# Patient Record
Sex: Female | Born: 1945 | Race: Black or African American | Hispanic: No | Marital: Married | Smoking: Never smoker
Health system: Southern US, Community
[De-identification: ages and names within clinical notes are randomized; demographics above are authoritative.]

## PROBLEM LIST (undated history)

## (undated) DIAGNOSIS — I10 Essential (primary) hypertension: Secondary | ICD-10-CM

## (undated) DIAGNOSIS — J45909 Unspecified asthma, uncomplicated: Secondary | ICD-10-CM

---

## 2014-03-20 ENCOUNTER — Encounter (HOSPITAL_COMMUNITY): Payer: Self-pay | Admitting: Emergency Medicine

## 2014-03-20 DIAGNOSIS — I10 Essential (primary) hypertension: Secondary | ICD-10-CM | POA: Diagnosis not present

## 2014-03-20 DIAGNOSIS — K529 Noninfective gastroenteritis and colitis, unspecified: Secondary | ICD-10-CM | POA: Insufficient documentation

## 2014-03-20 DIAGNOSIS — J45909 Unspecified asthma, uncomplicated: Secondary | ICD-10-CM | POA: Diagnosis not present

## 2014-03-20 DIAGNOSIS — R110 Nausea: Secondary | ICD-10-CM | POA: Diagnosis present

## 2014-03-20 NOTE — ED Notes (Signed)
Pt. felt nauseous with loose stools after eating a sandwich this afternoon , denies fever or chills.

## 2014-03-21 ENCOUNTER — Emergency Department (HOSPITAL_COMMUNITY)
Admission: EM | Admit: 2014-03-21 | Discharge: 2014-03-21 | Disposition: A | Discharge status: Home / Self Care | Payer: PRIVATE HEALTH INSURANCE | Attending: Emergency Medicine | Admitting: Emergency Medicine

## 2014-03-21 ENCOUNTER — Emergency Department (HOSPITAL_COMMUNITY): Payer: PRIVATE HEALTH INSURANCE

## 2014-03-21 ENCOUNTER — Encounter (HOSPITAL_COMMUNITY): Payer: Self-pay | Admitting: Radiology

## 2014-03-21 DIAGNOSIS — K529 Noninfective gastroenteritis and colitis, unspecified: Secondary | ICD-10-CM | POA: Diagnosis not present

## 2014-03-21 HISTORY — DX: Unspecified asthma, uncomplicated: J45.909

## 2014-03-21 HISTORY — DX: Essential (primary) hypertension: I10

## 2014-03-21 LAB — COMPREHENSIVE METABOLIC PANEL
ALK PHOS: 48 U/L (ref 39–117)
ALT: 18 U/L (ref 0–35)
AST: 29 U/L (ref 0–37)
Albumin: 4 g/dL (ref 3.5–5.2)
Anion gap: 9 (ref 5–15)
BUN: 14 mg/dL (ref 6–23)
CHLORIDE: 101 meq/L (ref 96–112)
CO2: 27 mmol/L (ref 19–32)
Calcium: 9.8 mg/dL (ref 8.4–10.5)
Creatinine, Ser: 1.02 mg/dL (ref 0.50–1.10)
GFR calc non Af Amer: 55 mL/min — ABNORMAL LOW (ref >90)
GFR, EST AFRICAN AMERICAN: 64 mL/min — AB (ref >90)
Glucose, Bld: 119 mg/dL — ABNORMAL HIGH (ref 70–99)
Potassium: 3.7 mmol/L (ref 3.5–5.1)
Sodium: 137 mmol/L (ref 135–145)
Total Bilirubin: 0.5 mg/dL (ref 0.3–1.2)
Total Protein: 7.4 g/dL (ref 6.0–8.3)

## 2014-03-21 LAB — CBC WITH DIFFERENTIAL/PLATELET
Basophils Absolute: 0 10*3/uL (ref 0.0–0.1)
Basophils Relative: 0 % (ref 0–1)
Eosinophils Absolute: 0 10*3/uL (ref 0.0–0.7)
Eosinophils Relative: 0 % (ref 0–5)
HCT: 41.1 % (ref 36.0–46.0)
Hemoglobin: 13.6 g/dL (ref 12.0–15.0)
Lymphocytes Relative: 11 % — ABNORMAL LOW (ref 12–46)
Lymphs Abs: 1.3 10*3/uL (ref 0.7–4.0)
MCH: 27.7 pg (ref 26.0–34.0)
MCHC: 33.1 g/dL (ref 30.0–36.0)
MCV: 83.7 fL (ref 78.0–100.0)
Monocytes Absolute: 1 10*3/uL (ref 0.1–1.0)
Monocytes Relative: 8 % (ref 3–12)
Neutro Abs: 10.1 10*3/uL — ABNORMAL HIGH (ref 1.7–7.7)
Neutrophils Relative %: 81 % — ABNORMAL HIGH (ref 43–77)
PLATELETS: 333 10*3/uL (ref 150–400)
RBC: 4.91 MIL/uL (ref 3.87–5.11)
RDW: 13.4 % (ref 11.5–15.5)
WBC: 12.4 10*3/uL — ABNORMAL HIGH (ref 4.0–10.5)

## 2014-03-21 LAB — LIPASE, BLOOD: Lipase: 27 U/L (ref 11–59)

## 2014-03-21 LAB — URINALYSIS, ROUTINE W REFLEX MICROSCOPIC
Bilirubin Urine: NEGATIVE
Glucose, UA: NEGATIVE mg/dL
HGB URINE DIPSTICK: NEGATIVE
KETONES UR: NEGATIVE mg/dL
Leukocytes, UA: NEGATIVE
NITRITE: NEGATIVE
Protein, ur: NEGATIVE mg/dL
Specific Gravity, Urine: 1.022 (ref 1.005–1.030)
UROBILINOGEN UA: 1 mg/dL (ref 0.0–1.0)
pH: 6.5 (ref 5.0–8.0)

## 2014-03-21 MED ORDER — IOHEXOL 300 MG/ML  SOLN
100.0000 mL | Freq: Once | INTRAMUSCULAR | Status: AC | PRN
  Administered 2014-03-21: 100 mL via INTRAVENOUS

## 2014-03-21 MED ORDER — METRONIDAZOLE 500 MG PO TABS
500.0000 mg | ORAL_TABLET | Freq: Three times a day (TID) | ORAL | Status: AC
Start: 2014-03-21 — End: ?

## 2014-03-21 MED ORDER — CIPROFLOXACIN HCL 500 MG PO TABS: 500.0000 mg | ORAL_TABLET | Freq: Two times a day (BID) | ORAL | Status: AC

## 2014-03-21 MED ORDER — SODIUM CHLORIDE 0.9 % IV BOLUS (SEPSIS)
1000.0000 mL | Freq: Once | INTRAVENOUS | Status: AC
Start: 2014-03-21 — End: 2014-03-21
  Administered 2014-03-21: 1000 mL via INTRAVENOUS

## 2014-03-21 MED ORDER — IOHEXOL 300 MG/ML  SOLN
25.0000 mL | Freq: Once | INTRAMUSCULAR | Status: AC | PRN
  Administered 2014-03-21: 25 mL via ORAL

## 2014-03-21 MED ORDER — HYDROCODONE-ACETAMINOPHEN 5-325 MG PO TABS: 1.0000 | ORAL_TABLET | Freq: Four times a day (QID) | ORAL | Status: AC | PRN

## 2014-03-21 NOTE — Discharge Instructions (Signed)
Cipro and Flagyl as prescribed.  Hydrocodone as prescribed as needed for pain.  Follow-up with your gastroenterologist next week, and return to the ER if your symptoms substantially worsen or change.   Colitis Colitis is inflammation of the colon. Colitis can be a short-term or long-standing (chronic) illness. Crohn's disease and ulcerative colitis are 2 types of colitis which are chronic. They usually require lifelong treatment. CAUSES  There are many different causes of colitis, including:  Viruses.  Germs (bacteria).  Medicine reactions. SYMPTOMS   Diarrhea.  Intestinal bleeding.  Pain.  Fever.  Throwing up (vomiting).  Tiredness (fatigue).  Weight loss.  Bowel blockage. DIAGNOSIS  The diagnosis of colitis is based on examination and stool or blood tests. X-rays, CT scan, and colonoscopy may also be needed. TREATMENT  Treatment may include:  Fluids given through the vein (intravenously).  Bowel rest (nothing to eat or drink for a period of time).  Medicine for pain and diarrhea.  Medicines (antibiotics) that kill germs.  Cortisone medicines.  Surgery. HOME CARE INSTRUCTIONS   Get plenty of rest.  Drink enough water and fluids to keep your urine clear or pale yellow.  Eat a well-balanced diet.  Call your caregiver for follow-up as recommended. SEEK IMMEDIATE MEDICAL CARE IF:   You develop chills.  You have an oral temperature above 102 F (38.9 C), not controlled by medicine.  You have extreme weakness, fainting, or dehydration.  You have repeated vomiting.  You develop severe belly (abdominal) pain or are passing bloody or tarry stools. MAKE SURE YOU:   Understand these instructions.  Will watch your condition.  Will get help right away if you are not doing well or get worse. Document Released: 04/15/2004 Document Revised: 05/31/2011 Document Reviewed: 07/11/2009 [facility] Patient Information 2015 [employer], [place]. This information is  not intended to replace advice given to you by your health care provider. Make sure you discuss any questions you have with your health care provider.

## 2014-03-21 NOTE — ED Notes (Signed)
Called Ct to inform of patient finishing contrast.

## 2014-03-21 NOTE — ED Notes (Signed)
Dr. Delo at bedside. 

## 2014-03-21 NOTE — ED Provider Notes (Signed)
CSN: [id number]     Arrival date & time 03/20/14  2342 History   First MD Initiated Contact with Patient 03/21/14 0005     This chart was scribed for Geoffery Lyonsouglas Shray Hunley, MD by Arlan OrganAshley Leger, ED Scribe. This patient was seen in room B18C/B18C and the patient's care was started 12:08 AM.   Chief Complaint  Patient presents with  . Nausea  . Diarrhea   Patient is a 68 y.o. female presenting with diarrhea. The history is provided by the patient. No language interpreter was used.  Diarrhea Quality:  Blood-tinged and watery Severity:  Mild Number of episodes:  1 Timing:  Rare Progression:  Unchanged Relieved by:  None tried Worsened by:  Nothing tried Ineffective treatments:  None tried Associated symptoms: abdominal pain   Associated symptoms: no chills and no fever   Abdominal pain:    Location:  Generalized   Severity:  Mild   Timing:  Rare   Progression:  Resolved   Chronicity:  New   HPI Comments: Sherri Becker is a 68 y.o. female with a PMHx of HTN who presents to the Emergency Department complaining of intermittent, mild nausea onset this afternoon. Pt also reports abdominal pain, diaphoresis, and 1 episode of diarrhea after eating a Bologna sandwich this afternoon. Stools described as loose with noted clots of blood. No fever, dysuria, or chills. Last colonoscopy perfomed this year with noted diverticulosis. No known sick contacts at home. PSHx includes a hysterectomy and cyst removal. No known allergies to medications.  Past Medical History  Diagnosis Date  . Hypertension   . Asthma    History reviewed. No pertinent past surgical history. No family history on file. History  Substance Use Topics  . Smoking status: Never Smoker   . Smokeless tobacco: Not on file  . Alcohol Use: No   OB History    No data available     Review of Systems  Constitutional: Negative for fever and chills.  Gastrointestinal: Positive for nausea, abdominal pain and diarrhea.  All other  systems reviewed and are negative.     Allergies  Review of patient's allergies indicates no known allergies.  Home Medications   Prior to Admission medications   Not on File   Triage Vitals: BP 160/79 mmHg  Pulse 82  Temp(Src) 97.7 F (36.5 C) (Oral)  Resp 17  Ht 5\' 6"  (1.676 m)  Wt 180 lb (81.647 kg)  BMI 29.07 kg/m2  SpO2 98%   Physical Exam  Constitutional: She is oriented to person, place, and time. She appears well-developed and well-nourished. No distress.  HENT:  Head: Normocephalic and atraumatic.  Eyes: EOM are normal.  Neck: Normal range of motion.  Cardiovascular: Normal rate, regular rhythm and normal heart sounds.   Pulmonary/Chest: Effort normal and breath sounds normal.  Abdominal: Soft. She exhibits no distension. There is no tenderness.  Musculoskeletal: Normal range of motion.  Neurological: She is alert and oriented to person, place, and time.  Skin: Skin is warm and dry.  Psychiatric: She has a normal mood and affect. Judgment normal.  Nursing note and vitals reviewed.   ED Course  Procedures (including critical care time)  DIAGNOSTIC STUDIES: Oxygen Saturation is 98% on RA, Normal by my interpretation.    COORDINATION OF CARE: 12:08 AM-Discussed treatment plan with pt at bedside and pt agreed to plan.     Labs Review Labs Reviewed - No data to display  Imaging Review No results found.   EKG Interpretation None  MDM   Final diagnoses:  None    Patient presents with complaints of loose stools, left-sided abdominal pain, and blood since earlier today. She has an elevated white count and CT scan reveals colitis. This will be treated with Cipro and Flagyl and pain meds. She is to return as needed if symptoms worsen or change. Her hemoglobin, blood pressure are very stable and she is not actively bleeding. She has a gastroenterologist and[facility] and I've advised her to follow-up with them next week.  I personally performed the  services described in this documentation, which was scribed in my presence. The recorded information has been reviewed and is accurate.    Geoffery Lyonsouglas Shatara Stanek, MD 03/21/14 [phone]

## 2016-01-18 IMAGING — CT CT ABD-PELV W/ CM
2 of 5 series · 14 of 46 positions shown, 16 images · IV contrast (Iodine)
Comparison: None.

CLINICAL DATA: Nausea and diarrhea.

EXAM:
CT ABDOMEN AND PELVIS WITH CONTRAST
TECHNIQUE: Multidetector CT imaging of the abdomen and pelvis was performed
using the standard protocol following bolus administration of
intravenous contrast.
CONTRAST:  100mL OMNIPAQUE IOHEXOL 300 MG/ML  SOLN

[Series 201: routine, idose (2) · axial · 0.64mm/px · z∈[-484,-54]mm · 11 of 98 slices shown, 13 images]
[im 6/98  soft-tissue]
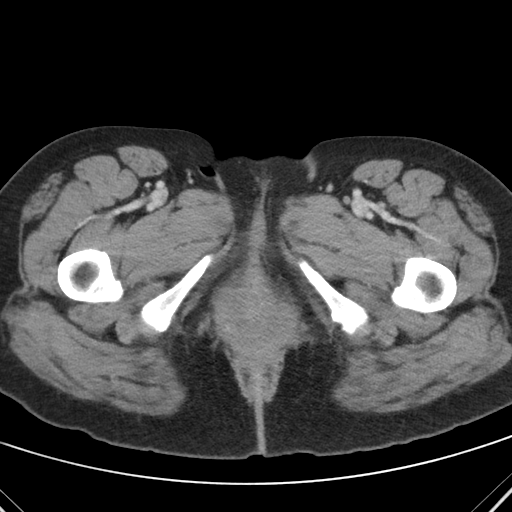
[im 6/98  bone]
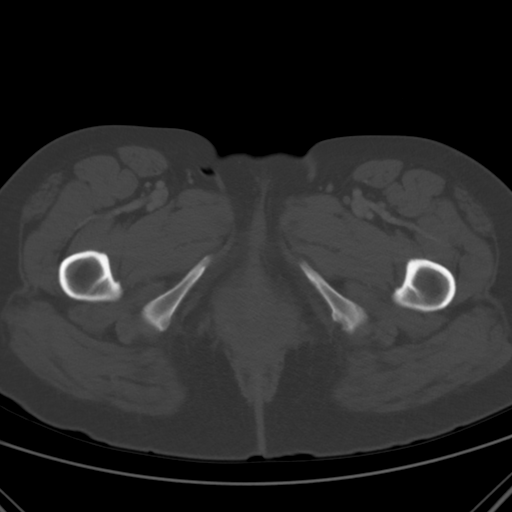
[im 16/98  soft-tissue]
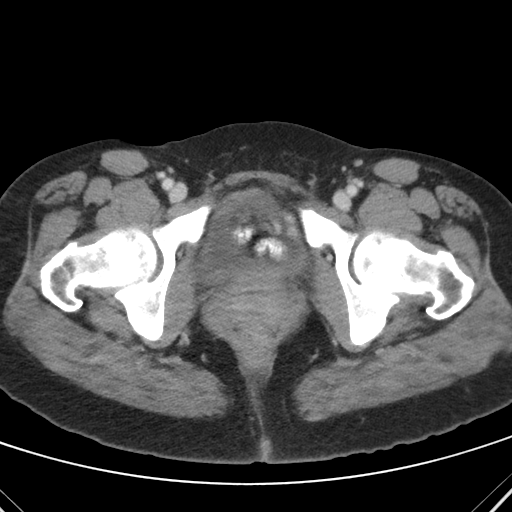
[im 26/98  soft-tissue]
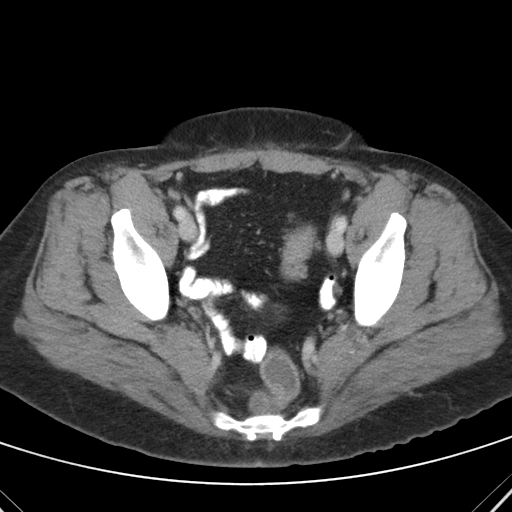
[im 31/98  soft-tissue]
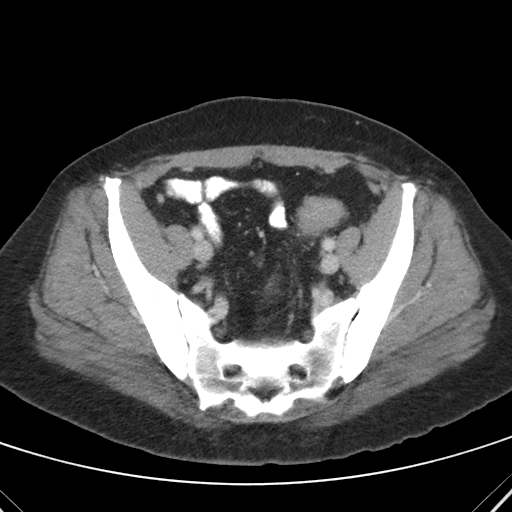
[im 41/98  soft-tissue]
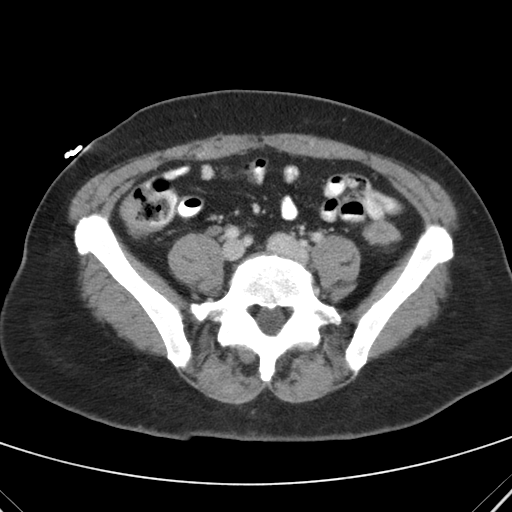
[im 52/98  soft-tissue]
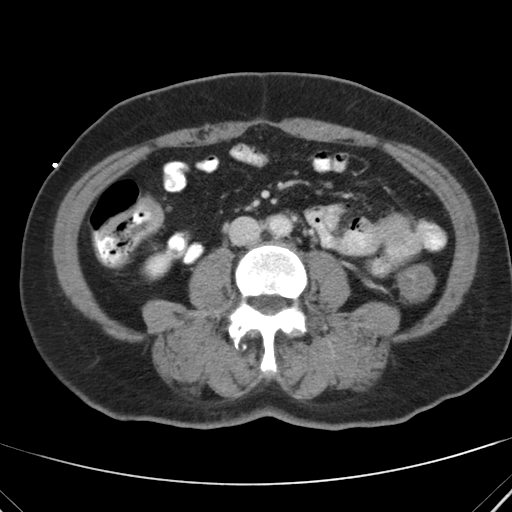
[im 57/98  soft-tissue]
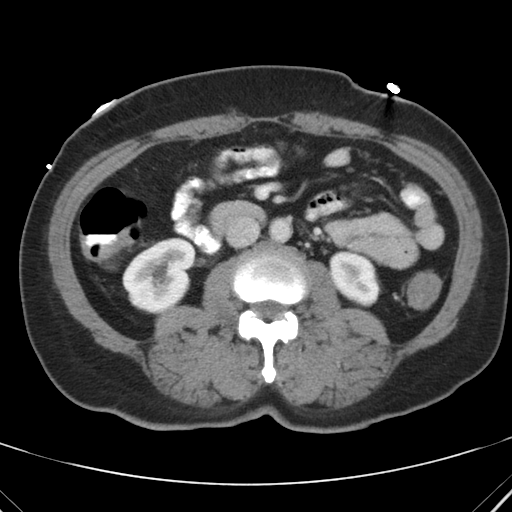
[im 67/98  soft-tissue]
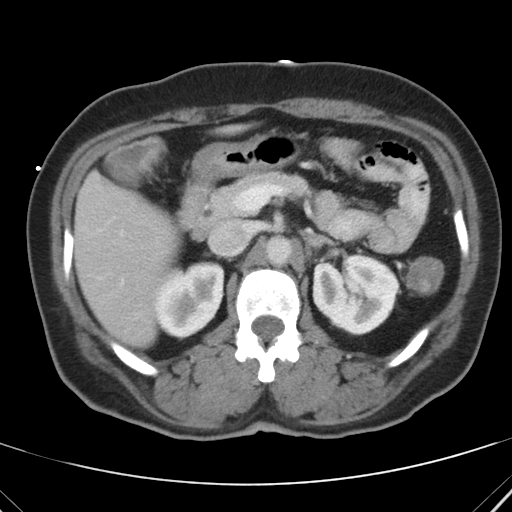
[im 72/98  soft-tissue]
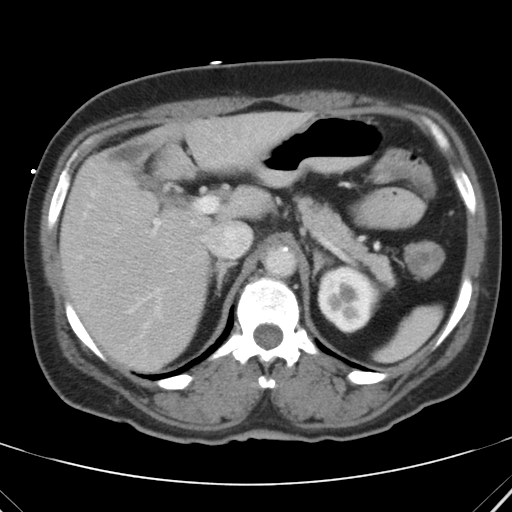
[im 72/98  bone]
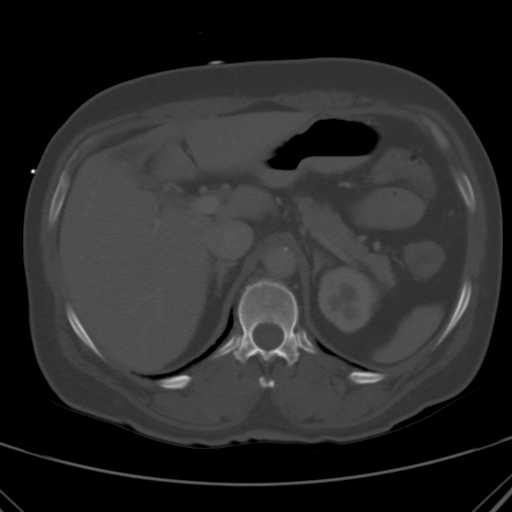
[im 82/98  soft-tissue]
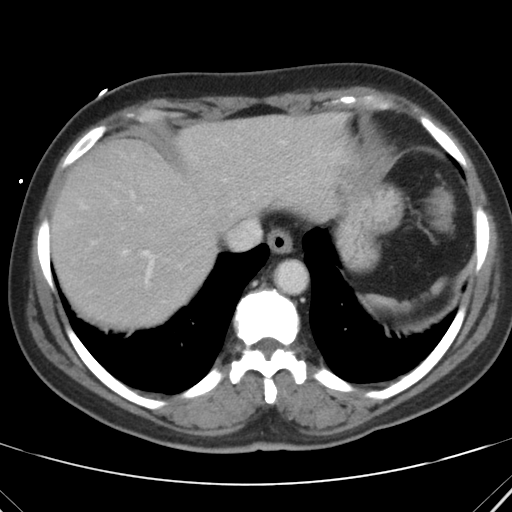
[im 92/98  soft-tissue]
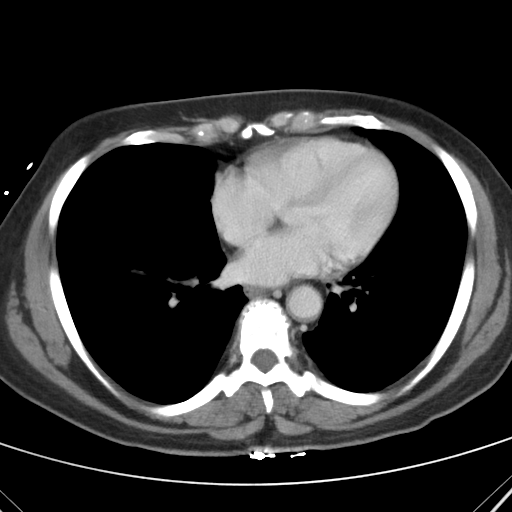

[Series 202: coronals, idose (2) · coronal · 0.50mm/px · 3 of 106 slices shown]
[im 36/106  soft-tissue]
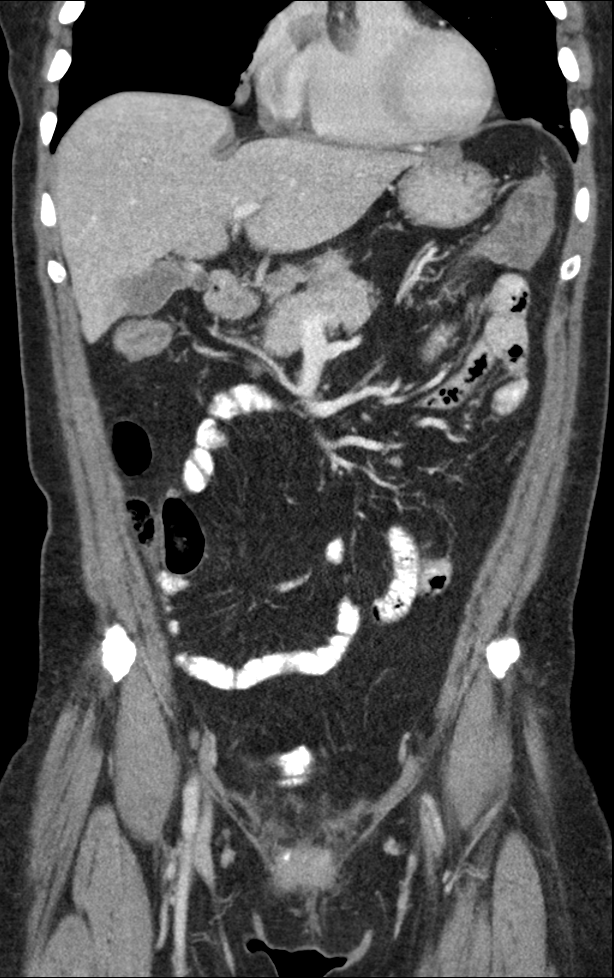
[im 47/106  soft-tissue]
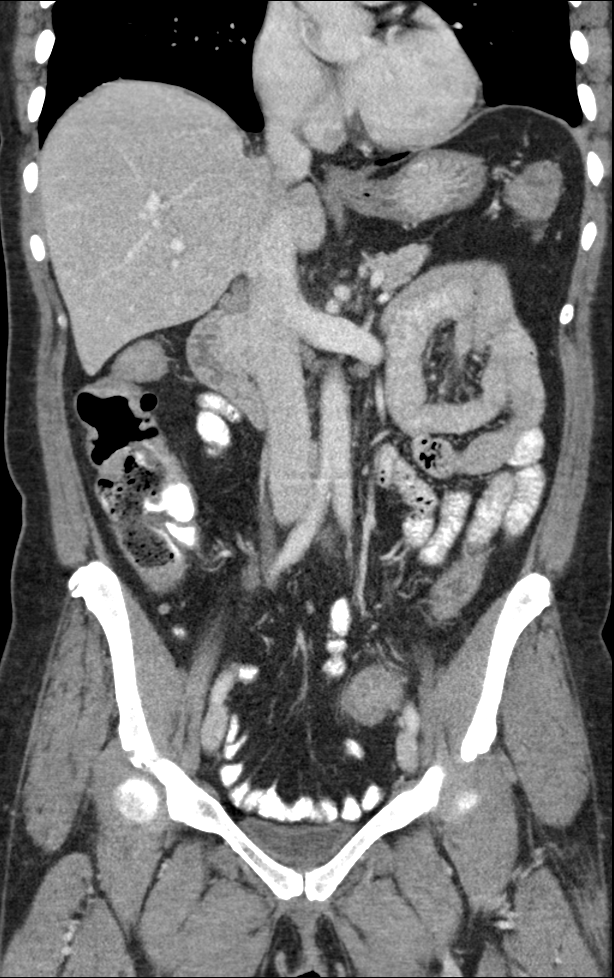
[im 59/106  soft-tissue]
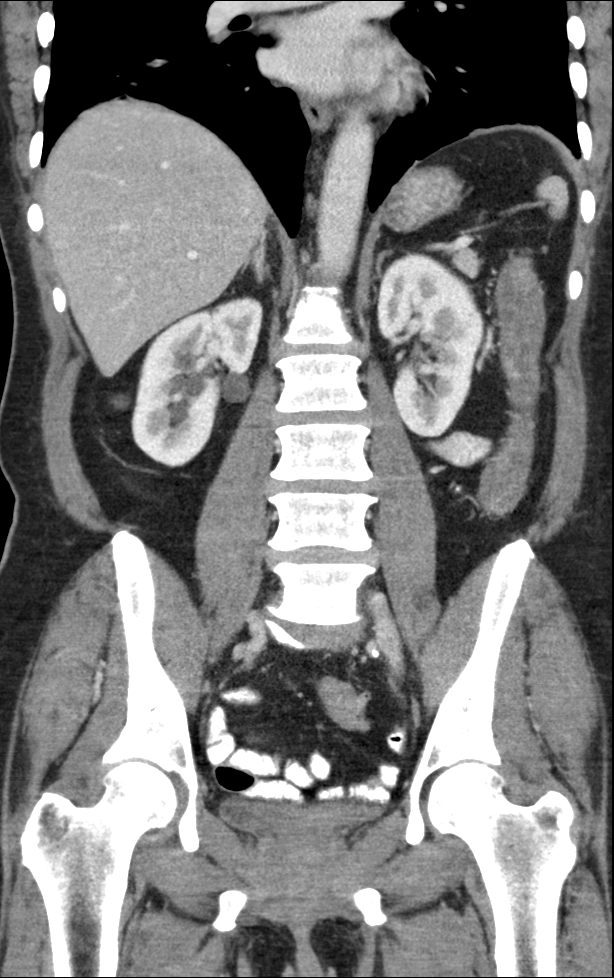

[14 of 46 positions shown; findings below may reference images not displayed]

FINDINGS: BODY WALL: Unremarkable.

LOWER CHEST: Unremarkable.

ABDOMEN/PELVIS:

Liver: No focal abnormality. Mildly prominent nodes in the deep
liver drainage, usually incidental.

Biliary: No evidence of biliary obstruction or stone.

Pancreas: Unremarkable.

Spleen: Unremarkable.

Adrenals: Unremarkable.

Kidneys and ureters: 15 mm simple cyst around the right renal hilum.
There is a 1 cm cyst in the left upper pole. No hydronephrosis or
urolithiasis .

Bladder: Unremarkable.

Reproductive: Hysterectomy and probable oophorectomies.

Bowel: There is circumferential bowel wall thickening from the level
of the hepatic flexure to the rectum. This is most likely
infectious; the pattern is not typical of ischemic colitis and
patient's age and history makes autoimmune colitis unlikely. No
bowel obstruction. Negative appendix. Distal colonic diverticulosis.

Retroperitoneum: No mass or adenopathy.

Peritoneum: No ascites or pneumoperitoneum.

Vascular: No acute abnormality.

OSSEOUS: No acute abnormalities. Advanced facet arthropathy
throughout the lumbar spine. Advanced degenerative endplate changes
in the lower thoracic spine.
IMPRESSION: Diffuse colitis, likely infectious.

## 2022-07-20 ENCOUNTER — Other Ambulatory Visit: Payer: Self-pay | Admitting: Orthopaedic Surgery

## 2022-08-09 NOTE — Progress Notes (Signed)
Surgical Instructions    Your procedure is scheduled on Tuesday May 28th.  Report to [facility] Main Entrance "A" at 5:30 A.M., then check in with the Admitting office.  Call this number if you have problems the morning of surgery:  [phone]   If you have any questions prior to your surgery date call [phone]: Open Monday-Friday 8am-4pm If you experience any cold or flu symptoms such as cough, fever, chills, shortness of breath, etc. between now and your scheduled surgery, please notify us at the above number     Remember:  Do not eat after midnight the night before your surgery  You may drink clear liquids until 4:30am the morning of your surgery.   Clear liquids allowed are: Water, Non-Citrus Juices (without pulp), Carbonated Beverages, Clear Tea, Black Coffee ONLY (NO MILK, CREAM OR POWDERED CREAMER of any kind), and Gatorade   Enhanced Recovery after Surgery for Orthopedics Enhanced Recovery after Surgery is a protocol used to improve the stress on your body and your recovery after surgery.  Patient Instructions  The day of surgery (if you do NOT have diabetes):  Drink ONE (1) Pre-Surgery Clear Ensure by _4:30____ am the morning of surgery   This drink was given to you during your hospital  pre-op appointment visit. Nothing else to drink after completing the  Pre-Surgery Clear Ensure.         If you have questions, please contact your surgeon's office.   Take these medicines the morning of surgery with A SIP OF WATER: ciprofloxacin (CIPRO) 500 MG tablet  metroNIDAZOLE (FLAGYL) 500 MG tablet    IF NEEDED  HYDROcodone-acetaminophen (NORCO) 5-325 MG per tablet    As of today, STOP taking any Aspirin (unless otherwise instructed by your surgeon) Aleve, Naproxen, Ibuprofen, Motrin, Advil, Goody's, BC's, all herbal medications, fish oil, and all vitamins.           Do not wear jewelry or makeup. Do not wear lotions, powders, perfumes or deodorant. Do not shave 48  hours prior to surgery.   Do not bring valuables to the hospital. Do not wear nail polish, gel polish, artificial nails, or any other type of covering on natural nails (fingers and toes) If you have artificial nails or gel coating that need to be removed by a nail salon, please have this removed prior to surgery. Artificial nails or gel coating may interfere with anesthesia's ability to adequately monitor your vital signs.  [facility] is not responsible for any belongings or valuables.    Do NOT Smoke (Tobacco/Vaping)  24 hours prior to your procedure  If you use a CPAP at night, you may bring your mask for your overnight stay.   Contacts, glasses, hearing aids, dentures or partials may not be worn into surgery, please bring cases for these belongings   For patients admitted to the hospital, discharge time will be determined by your treatment team.   Patients discharged the day of surgery will not be allowed to drive home, and someone needs to stay with them for 24 hours.   SURGICAL WAITING ROOM VISITATION Patients having surgery or a procedure may have no more than 2 support people in the waiting area - these visitors may rotate.   Children under the age of 23 must have an adult with them who is not the patient. If the patient needs to stay at the hospital during part of their recovery, the visitor guidelines for inpatient rooms apply. Pre-op nurse will coordinate an appropriate  time for 1 support person to accompany patient in pre-op.  This support person may not rotate.   Please refer to [email or web address] for the visitor guidelines for Inpatients (after your surgery is over and you are in a regular room).    Special instructions:    Oral Hygiene is also important to reduce your risk of infection.  Remember - BRUSH YOUR TEETH THE MORNING OF SURGERY WITH YOUR REGULAR TOOTHPASTE   - Preparing For Surgery  Before  surgery, you can play an important role. Because skin is not sterile, your skin needs to be as free of germs as possible. You can reduce the number of germs on your skin by washing with CHG (chlorahexidine gluconate) Soap before surgery.  CHG is an antiseptic cleaner which kills germs and bonds with the skin to continue killing germs even after washing.     Please do not use if you have an allergy to CHG or antibacterial soaps. If your skin becomes reddened/irritated stop using the CHG.  Do not shave (including legs and underarms) for at least 48 hours prior to first CHG shower. It is OK to shave your face.  Please follow these instructions carefully.     Shower the NIGHT BEFORE SURGERY and the MORNING OF SURGERY with CHG Soap.   If you chose to wash your hair, wash your hair first as usual with your normal shampoo. After you shampoo, rinse your hair and body thoroughly to remove the shampoo.  Then [employer] and genitals (private parts) with your normal soap and rinse thoroughly to remove soap.  After that Use CHG Soap as you would any other liquid soap. You can apply CHG directly to the skin and wash gently with a scrungie or a clean washcloth.   Apply the CHG Soap to your body ONLY FROM THE NECK DOWN.  Do not use on open wounds or open sores. Avoid contact with your eyes, ears, mouth and genitals (private parts). Wash Face and genitals (private parts)  with your normal soap.   Wash thoroughly, paying special attention to the area where your surgery will be performed.  Thoroughly rinse your body with warm water from the neck down.  DO NOT shower/wash with your normal soap after using and rinsing off the CHG Soap.  Pat yourself dry with a CLEAN TOWEL.  Wear CLEAN PAJAMAS to bed the night before surgery  Place CLEAN SHEETS on your bed the night before your surgery  DO NOT SLEEP WITH PETS.   Day of Surgery:  Take a shower with CHG soap. Wear Clean/Comfortable clothing the morning of  surgery Do not apply any deodorants/lotions.   Remember to brush your teeth WITH YOUR REGULAR TOOTHPASTE.    If you received a COVID test during your pre-op visit, it is requested that you wear a mask when out in public, stay away from anyone that may not be feeling well, and notify your surgeon if you develop symptoms. If you have been in contact with anyone that has tested positive in the last 10 days, please notify your surgeon.    Please read over the following fact sheets that you were given.

## 2022-08-10 ENCOUNTER — Inpatient Hospital Stay (HOSPITAL_COMMUNITY)
Admission: RE | Admit: 2022-08-10 | Discharge: 2022-08-10 | Disposition: A | Discharge status: Home / Self Care | Payer: TRICARE For Life (TFL) | Source: Ambulatory Visit

## 2022-08-10 NOTE — Progress Notes (Addendum)
1022 Called patient to find out if they were coming to their pre op appt.  No answer  and unable to leave message due to "memory full".   Called patient's daughter Sherri Becker who answered. She connected me with her mother Sherri Becker who was unaware of this appointment, this surgery, or this surgeon.   Will call Dr. Donnie Mesa office to make them aware as well.   [phone] CAlled Dr. Donnie Mesa office and left message with his scheduler Dois Davenport.

## 2022-08-17 ENCOUNTER — Encounter (HOSPITAL_COMMUNITY): Admission: RE | Payer: Self-pay | Source: Home / Self Care

## 2022-08-17 ENCOUNTER — Ambulatory Visit (HOSPITAL_COMMUNITY)
Admission: RE | Admit: 2022-08-17 | Payer: TRICARE For Life (TFL) | Source: Home / Self Care | Admitting: Orthopaedic Surgery

## 2022-08-17 SURGERY — ARTHROSCOPY, ANKLE, WITH FUSION
Anesthesia: General | Site: Ankle | Laterality: Left
